# Patient Record
Sex: Male | Born: 2000 | Race: Black or African American | Hispanic: No | Marital: Single | Smoking: Never smoker
Health system: Southern US, Community
[De-identification: ages and names within clinical notes are randomized; demographics above are authoritative.]

## PROBLEM LIST (undated history)

## (undated) DIAGNOSIS — J45909 Unspecified asthma, uncomplicated: Secondary | ICD-10-CM

---
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
## 2019-10-25 ENCOUNTER — Other Ambulatory Visit: Payer: Self-pay

## 2019-10-25 ENCOUNTER — Ambulatory Visit: Payer: BC Managed Care – PPO | Admitting: Internal Medicine

## 2019-10-25 ENCOUNTER — Encounter: Payer: Self-pay | Admitting: Internal Medicine

## 2019-10-25 ENCOUNTER — Ambulatory Visit (INDEPENDENT_AMBULATORY_CARE_PROVIDER_SITE_OTHER): Payer: Medicaid Other | Admitting: Internal Medicine

## 2019-10-25 VITALS — BP 130/86 | HR 89 | Temp 97.2°F | Resp 18 | Ht 72.0 in | Wt >= 6400 oz

## 2019-10-25 DIAGNOSIS — R030 Elevated blood-pressure reading, without diagnosis of hypertension: Secondary | ICD-10-CM

## 2019-10-25 DIAGNOSIS — R7303 Prediabetes: Secondary | ICD-10-CM | POA: Diagnosis not present

## 2019-10-25 DIAGNOSIS — J4520 Mild intermittent asthma, uncomplicated: Secondary | ICD-10-CM

## 2019-10-25 DIAGNOSIS — Z7689 Persons encountering health services in other specified circumstances: Secondary | ICD-10-CM

## 2019-10-25 DIAGNOSIS — I10 Essential (primary) hypertension: Secondary | ICD-10-CM | POA: Insufficient documentation

## 2019-10-25 LAB — POCT GLYCOSYLATED HEMOGLOBIN (HGB A1C)
HbA1c POC (<> result, manual entry): 5.6 % (ref 4.0–5.6)
HbA1c, POC (controlled diabetic range): 5.6 % (ref 0.0–7.0)
HbA1c, POC (prediabetic range): 5.6 % — AB (ref 5.7–6.4)
Hemoglobin A1C: 5.6 % (ref 4.0–5.6)

## 2019-10-25 NOTE — Progress Notes (Signed)
New Patient Office Visit  Subjective:  Patient ID: Raymond Turner, male    DOB: 02/10/2000  Age: 19 y.o. MRN: [id number]  CC:  Chief Complaint  Patient presents with   New Patient (Initial Visit)    new pt former [facility] pediatric pt no concerns at this time     HPI Raymond Turner is a 19 year old male with PMH of asthma and morbid obesity presents for establishing care. Patient had dysuria last week, for which he took over-the-counter Azo, which helped him. He currently denies any dysuria, hematuria, flank pain, fever, chills, nausea or vomiting.  Patient had asthma in childhood, for which he used Albuterol inhaler PRN. He has not required it for long time.  Patient's BP was 158/94 initially, which upon manual check was 130/86. Patient denies any h/o HTN, and denies headache, dizziness, chest pain, dyspnea or palpitations. Patient has been trying to lose weight by going to gym and does cardio exercises at least 2-3 times in a week. 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  Patient has had both doses of COVID vaccination. Patient denies flu vaccine despite explaining benefits.  Past Medical History:  Diagnosis Date   Asthma     Past Surgical History:  Procedure Laterality Date   KNEE ARTHROSCOPY WITH ANTERIOR CRUCIATE LIGAMENT (ACL) REPAIR Left 08/08/2018   Procedure: KNEE ARTHROSCOPY WITH ANTERIOR CRUCIATE LIGAMENT (ACL) REPAIR;  Surgeon: Hiram Gash, MD;  Location: [place];  Service: Orthopedics;  Laterality: Left;   KNEE ARTHROSCOPY WITH MENISCAL REPAIR Left 08/08/2018   Procedure: KNEE ARTHROSCOPY WITH MENISCAL REPAIR;  Surgeon: Hiram Gash, MD;  Location: [place];  Service: Orthopedics;  Laterality: Left;    Family History  Problem Relation Age of Onset   Hypertension Mother     Social History   Socioeconomic History   Marital status: Single    Spouse name: Not on file   Number of children: Not on file   Years of education: Not  on file   Highest education level: Not on file  Occupational History    Employer: [employer]    Comment: Production team member  Tobacco Use   Smoking status: Never Smoker   Smokeless tobacco: Never Used  Vaping Use   Vaping Use: Never used  Substance and Sexual Activity   Alcohol use: Yes    Comment: Occasionally   Drug use: No   Sexual activity: Not on file  Other Topics Concern   Not on file  Social History Narrative   Lives with mother and brothers   Social Determinants of Health   Financial Resource Strain:    Difficulty of Paying Living Expenses: Not on file  Food Insecurity:    Worried About Charity fundraiser in the Last Year: Not on file   [employer] of Food in the Last Year: Not on file  Transportation Needs:    Lack of Transportation (Medical): Not on file   Lack of Transportation (Non-Medical): Not on file  Physical Activity:    Days of Exercise per Week: Not on file   Minutes of Exercise per Session: Not on file  Stress:    Feeling of Stress : Not on file  Social Connections:    Frequency of Communication with Friends and Family: Not on file   Frequency of Social Gatherings with Friends and Family: Not on file   Attends Religious Services: Not on file  Active Member of Clubs or Organizations: Not on file   Attends Archivist Meetings: Not on file   Marital Status: Not on file  Intimate Partner Violence:    Fear of Current or Ex-Partner: Not on file   Emotionally Abused: Not on file   Physically Abused: Not on file   Sexually Abused: Not on file    ROS Review of Systems  Constitutional: Negative for chills and fever.  HENT: Negative for congestion, sinus pressure, sinus pain and sore throat.   Eyes: Negative for pain and discharge.  Respiratory: Negative for cough and shortness of breath.   Cardiovascular: Negative for chest pain and palpitations.  Gastrointestinal: Negative for constipation, diarrhea, nausea  and vomiting.  Endocrine: Negative for polydipsia and polyuria.  Genitourinary: Negative for dysuria and hematuria.  Musculoskeletal: Negative for neck pain and neck stiffness.  Skin: Negative for rash.  Neurological: Negative for dizziness, weakness, numbness and headaches.  Psychiatric/Behavioral: Negative for agitation and behavioral problems.    Objective:   Today's Vitals: BP 130/86 (BP Location: Right Arm, Patient Position: Sitting)    Pulse 89    Temp (!) 97.2 F (36.2 C) (Temporal)    Resp 18    Ht 6' (1.829 m)    Wt (!) 455 lb 1.9 oz (206.4 kg)    SpO2 96%    BMI 61.73 kg/m   Physical Exam Vitals reviewed.  Constitutional:      General: He is not in acute distress.    Appearance: He is obese. He is not diaphoretic.  HENT:     Head: Normocephalic and atraumatic.     Nose: Nose normal.     Mouth/Throat:     Mouth: Mucous membranes are moist.  Eyes:     General: No scleral icterus.    Extraocular Movements: Extraocular movements intact.     Pupils: Pupils are equal, round, and reactive to light.  Cardiovascular:     Rate and Rhythm: Normal rate and regular rhythm.     Pulses: Normal pulses.     Heart sounds: Normal heart sounds. No murmur heard.  No friction rub. No gallop.   Pulmonary:     Breath sounds: Normal breath sounds. No wheezing or rales.  Abdominal:     Palpations: Abdomen is soft.     Tenderness: There is no abdominal tenderness. There is no right CVA tenderness, left CVA tenderness, guarding or rebound.  Musculoskeletal:     Cervical back: Neck supple. No rigidity or tenderness.     Right lower leg: No edema.     Left lower leg: No edema.  Skin:    General: Skin is warm.     Findings: No rash.  Neurological:     General: No focal deficit present.     Mental Status: He is alert and oriented to person, place, and time.     Sensory: No sensory deficit.     Motor: No weakness.  Psychiatric:        Mood and Affect: Mood normal.        Behavior:  Behavior normal.     Assessment & Plan:   Problem List Items Addressed This Visit      Encounter to establish care - Primary   Care established Previous chart reviewed History and medications reviewed with the patient     Relevant Orders  CBC with Differential  CMP14+EGFR  Lipid Profile  T4 AND TSH  POCT glycosylated hemoglobin (Hb A1C)   Prehypertension BP: 158/94,  repeat manual check BP: 130/86 EKG: Sinus arrhythmia. HR: 62. No ST or T wave abnormality to suggest active ischemia.  Denies headache, dizziness, chest pain, dyspnea or palpitations Advised to follow DASH diet and continue to perform moderate exercise, at least 150 mins/week F/u CMP and lipid profile  Prediabetes HbA1C: 5.6 today in office Advised to follow diabetic diet F/u CMP and lipid panel  Morbid obesity ([place]) Patient goes to gym for cardio exercise in an effort to lose weight. Discussed diet modification and continuing exercise Discussed medical management of weight, patient would like to try with diet modification and exercise for now. Will readdress in the next visit  Asthma, mild intermittent Well-controlled, does not need Albuterol inhaler now         Outpatient Encounter Medications as of 10/25/2019  Medication Sig   [DISCONTINUED] albuterol (PROVENTIL HFA;VENTOLIN HFA) 108 (90 BASE) MCG/ACT inhaler Inhale 2 puffs into the lungs every 6 (six) hours as needed for wheezing (4 times daily and as needed for cough/wheez.). (Patient not taking: Reported on 10/25/2019)   No facility-administered encounter medications on file as of 10/25/2019.    Follow-up: Return in about 3 months (around 01/25/2020).   Lindell Spar, MD

## 2019-10-25 NOTE — Assessment & Plan Note (Signed)
HbA1C: 5.6 today in office Advised to follow diabetic diet F/u CMP and lipid panel

## 2019-10-25 NOTE — Assessment & Plan Note (Signed)
Care established Previous chart reviewed History and medications reviewed with the patient 

## 2019-10-25 NOTE — Assessment & Plan Note (Signed)
Well-controlled, does not need Albuterol inhaler now

## 2019-10-25 NOTE — Assessment & Plan Note (Signed)
Patient goes to gym for cardio exercise in an effort to lose weight. Discussed diet modification and continuing exercise Discussed medical management of weight, patient would like to try with diet modification and exercise for now. Will readdress in the next visit

## 2019-10-25 NOTE — Patient Instructions (Signed)
Please continue to perform moderate exercise/yoga/aerobic exercise, at least 150 mins/week.  Please follow DASH diet for better control of blood pressure.  DASH stands for Dietary Approaches to Stop Hypertension. The DASH diet is a healthy-eating plan designed to help treat or prevent high blood pressure (hypertension).  The DASH diet includes foods that are rich in potassium, calcium and magnesium. These nutrients help control blood pressure. The diet limits foods that are high in sodium, saturated fat and added sugars.  Studies have shown that the DASH diet can lower blood pressure in as little as two weeks. The diet can also lower low-density lipoprotein (LDL or "bad") cholesterol levels in the blood. High blood pressure and high LDL cholesterol levels are two major risk factors for heart disease and stroke.    DASH diet: Recommended servings The DASH diet provides daily and weekly nutritional goals. The number of servings you should have depends on your daily calorie needs.  Here's a look at the recommended servings from each food group for a 2,000-calorie-a-day DASH diet:  Grains: 6 to 8 servings a day. One serving is one slice bread, 1 ounce dry cereal, or 1/2 cup cooked cereal, rice or pasta. Vegetables: 4 to 5 servings a day. One serving is 1 cup raw leafy green vegetable, 1/2 cup cut-up raw or cooked vegetables, or 1/2 cup vegetable juice. Fruits: 4 to 5 servings a day. One serving is one medium fruit, 1/2 cup fresh, frozen or canned fruit, or 1/2 cup fruit juice. Fat-free or low-fat dairy products: 2 to 3 servings a day. One serving is 1 cup milk or yogurt, or 1 1/2 ounces cheese. Lean meats, poultry and fish: six 1-ounce servings or fewer a day. One serving is 1 ounce cooked meat, poultry or fish, or 1 egg. Nuts, seeds and legumes: 4 to 5 servings a week. One serving is 1/3 cup nuts, 2 tablespoons peanut butter, 2 tablespoons seeds, or 1/2 cup cooked legumes (dried beans or  peas). Fats and oils: 2 to 3 servings a day. One serving is 1 teaspoon soft margarine, 1 teaspoon vegetable oil, 1 tablespoon mayonnaise or 2 tablespoons salad dressing. Sweets and added sugars: 5 servings or fewer a week. One serving is 1 tablespoon sugar, jelly or jam, 1/2 cup sorbet, or 1 cup lemonade.

## 2019-10-25 NOTE — Assessment & Plan Note (Addendum)
BP: 158/94, repeat manual check BP: 130/86 EKG: Sinus arrhythmia. HR: 62. No ST or T wave abnormality to suggest active ischemia.  Denies headache, dizziness, chest pain, dyspnea or palpitations Advised to follow DASH diet and continue to perform moderate exercise, at least 150 mins/week F/u CMP and lipid profile

## 2019-10-26 LAB — CBC WITH DIFFERENTIAL/PLATELET
Basophils Absolute: 0 10*3/uL (ref 0.0–0.2)
Basos: 1 %
EOS (ABSOLUTE): 0.2 10*3/uL (ref 0.0–0.4)
Eos: 3 %
Hematocrit: 43 % (ref 37.5–51.0)
Hemoglobin: 13.9 g/dL (ref 13.0–17.7)
Immature Grans (Abs): 0 10*3/uL (ref 0.0–0.1)
Immature Granulocytes: 0 %
Lymphocytes Absolute: 2.6 10*3/uL (ref 0.7–3.1)
Lymphs: 45 %
MCH: 27.9 pg (ref 26.6–33.0)
MCHC: 32.3 g/dL (ref 31.5–35.7)
MCV: 86 fL (ref 79–97)
Monocytes Absolute: 0.3 10*3/uL (ref 0.1–0.9)
Monocytes: 6 %
Neutrophils Absolute: 2.6 10*3/uL (ref 1.4–7.0)
Neutrophils: 45 %
Platelets: 270 10*3/uL (ref 150–450)
RBC: 4.98 x10E6/uL (ref 4.14–5.80)
RDW: 13 % (ref 11.6–15.4)
WBC: 5.7 10*3/uL (ref 3.4–10.8)

## 2019-10-26 LAB — LIPID PANEL
Chol/HDL Ratio: 3.7 ratio (ref 0.0–5.0)
Cholesterol, Total: 161 mg/dL (ref 100–199)
HDL: 43 mg/dL (ref >39)
LDL Chol Calc (NIH): 101 mg/dL — ABNORMAL HIGH (ref 0–99)
Triglycerides: 92 mg/dL (ref 0–149)
VLDL Cholesterol Cal: 17 mg/dL (ref 5–40)

## 2019-10-26 LAB — CMP14+EGFR
ALT: 56 IU/L — ABNORMAL HIGH (ref 0–44)
AST: 33 IU/L (ref 0–40)
Albumin/Globulin Ratio: 1.5 (ref 1.2–2.2)
Albumin: 4.4 g/dL (ref 4.1–5.2)
Alkaline Phosphatase: 77 IU/L (ref 51–125)
BUN/Creatinine Ratio: 14 (ref 9–20)
BUN: 11 mg/dL (ref 6–20)
Bilirubin Total: 0.6 mg/dL (ref 0.0–1.2)
CO2: 20 mmol/L (ref 20–29)
Calcium: 9.4 mg/dL (ref 8.7–10.2)
Chloride: 106 mmol/L (ref 96–106)
Creatinine, Ser: 0.81 mg/dL (ref 0.76–1.27)
GFR calc Af Amer: 148 mL/min/{1.73_m2} (ref >59)
GFR calc non Af Amer: 128 mL/min/{1.73_m2} (ref >59)
Globulin, Total: 2.9 g/dL (ref 1.5–4.5)
Glucose: 77 mg/dL (ref 65–99)
Potassium: 4.7 mmol/L (ref 3.5–5.2)
Sodium: 140 mmol/L (ref 134–144)
Total Protein: 7.3 g/dL (ref 6.0–8.5)

## 2019-10-26 LAB — T4 AND TSH
T4, Total: 8.1 ug/dL (ref 4.5–12.0)
TSH: 2.65 u[IU]/mL (ref 0.450–4.500)

## 2020-01-22 ENCOUNTER — Telehealth (INDEPENDENT_AMBULATORY_CARE_PROVIDER_SITE_OTHER): Payer: Medicaid Other | Admitting: Internal Medicine

## 2020-01-22 ENCOUNTER — Encounter: Payer: Self-pay | Admitting: Internal Medicine

## 2020-01-22 ENCOUNTER — Other Ambulatory Visit: Payer: Self-pay

## 2020-01-22 VITALS — Ht 72.0 in

## 2020-01-22 DIAGNOSIS — R7303 Prediabetes: Secondary | ICD-10-CM | POA: Diagnosis not present

## 2020-01-22 DIAGNOSIS — R030 Elevated blood-pressure reading, without diagnosis of hypertension: Secondary | ICD-10-CM | POA: Diagnosis not present

## 2020-01-22 NOTE — Progress Notes (Signed)
Virtual Visit via Telephone Note   This visit type was conducted due to national recommendations for restrictions regarding the COVID-19 Pandemic (e.g. social distancing) in an effort to limit this patient's exposure and mitigate transmission in our community.  Due to his co-morbid illnesses, this patient is at least at moderate risk for complications without adequate follow up.  This format is felt to be most appropriate for this patient at this time.  The patient did not have access to video technology/had technical difficulties with video requiring transitioning to audio format only (telephone).  All issues noted in this document were discussed and addressed.  No physical exam could be performed with this format.  Evaluation Performed:  Follow-up visit  Date:  01/22/2020   ID:  Raymond Turner, DOB 07-13-2000, MRN [id number]  Patient Location: Home Provider Location: Home Office  Participants: Patient Location of Patient: Home Location of Provider: Telehealth Consent was obtain for visit to be over via telehealth. I verified that I am speaking with the correct person using two identifiers.  PCP:  Anabel Halon, MD   Chief Complaint: Follow-up of chronic medical conditions  History of Present Illness:    Raymond Turner is a 20 y.o. male with PMH of asthma, pre-hypertension, prediabetes and morbid obesity who has a televisit for follow-up of his chronic medical conditions.  Patient states that he has been doing well overall.  He has been trying to follow low carbohydrate diet for his prediabetes.  He has not checked his BP since the last visit.  He denies any headache, dizziness, chest pain, dyspnea or palpitations.  The patient does not have symptoms concerning for COVID-19 infection (fever, chills, cough, or new shortness of breath).   Past Medical, Surgical, Social History, Allergies, and Medications have been Reviewed.  Past Medical History:  Diagnosis Date  . Asthma     Past Surgical History:  Procedure Laterality Date  . KNEE ARTHROSCOPY WITH ANTERIOR CRUCIATE LIGAMENT (ACL) REPAIR Left 08/08/2018   Procedure: KNEE ARTHROSCOPY WITH ANTERIOR CRUCIATE LIGAMENT (ACL) REPAIR;  Surgeon: Bjorn Pippin, MD;  Location: [facility] OR;  Service: Orthopedics;  Laterality: Left;  . KNEE ARTHROSCOPY WITH MENISCAL REPAIR Left 08/08/2018   Procedure: KNEE ARTHROSCOPY WITH MENISCAL REPAIR;  Surgeon: Bjorn Pippin, MD;  Location: [facility] OR;  Service: Orthopedics;  Laterality: Left;     No outpatient medications have been marked as taking for the 01/22/20 encounter (Video Visit) with Anabel Halon, MD.     Allergies:   Patient has no known allergies.   ROS:   Please see the history of present illness.     All other systems reviewed and are negative.   Labs/Other Tests and Data Reviewed:    Recent Labs: 10/25/2019: ALT 56; BUN 11; Creatinine, Ser 0.81; Hemoglobin 13.9; Platelets 270; Potassium 4.7; Sodium 140; TSH 2.650   Recent Lipid Panel Lab Results  Component Value Date/Time   CHOL 161 10/25/2019 10:52 AM   TRIG 92 10/25/2019 10:52 AM   HDL 43 10/25/2019 10:52 AM   CHOLHDL 3.7 10/25/2019 10:52 AM   CHOLHDL 2.9 08/15/2015 03:45 PM   LDLCALC 101 (H) 10/25/2019 10:52 AM    Wt Readings from Last 3 Encounters:  10/25/19 (!) 455 lb 1.9 oz (206.4 kg)  08/08/18 (!) 417 lb (189.1 kg) (>99 %, Z= 3.99)*  08/04/18 (!) 414 lb 4 oz (187.9 kg) (>99 %, Z= 3.97)*   * Growth percentiles are based on CDC (Boys, 2-20 Years)  data.      ASSESSMENT & PLAN:    Prediabetes Advised to continue to follow low carbohydrate diet We will check CMP and HbA1C in the next visit  Prehypertension Advised to follow low sodium diet and perform moderate exercise/walking  Morbid obesity Diet modification and moderate exercise advised.   Time:   Today, I have spent 10 minutes reviewing the chart, including problem list, medications, and with the patient with telehealth technology  discussing the above problems.   Medication Adjustments/Labs and Tests Ordered: Current medicines are reviewed at length with the patient today.  Concerns regarding medicines are outlined above.   Tests Ordered: No orders of the defined types were placed in this encounter.   Medication Changes: No orders of the defined types were placed in this encounter.    Note: This dictation was prepared with Dragon dictation along with smaller phrase technology. Similar sounding words can be transcribed inadequately or may not be corrected upon review. Any transcriptional errors that result from this process are unintentional.      Disposition:  Follow up  Signed, Anabel Halon, MD  01/22/2020 11:45 AM     [facility]

## 2020-01-22 NOTE — Patient Instructions (Signed)
Please continue to follow low carbohydrate and low-sodium diet.  Please perform moderate exercise/walking at least 150 minutes/week.  You are advised to check blood pressure at home or pharmacy.  Please contact us if your blood pressure is persistently elevated more than 140/90.

## 2020-01-25 ENCOUNTER — Ambulatory Visit: Payer: Medicaid Other | Admitting: Internal Medicine

## 2020-07-15 ENCOUNTER — Encounter: Payer: Self-pay | Admitting: Pediatrics

## 2020-07-22 ENCOUNTER — Encounter: Payer: Medicaid Other | Admitting: Internal Medicine

## 2020-08-26 ENCOUNTER — Other Ambulatory Visit: Payer: Self-pay

## 2020-08-26 ENCOUNTER — Encounter: Payer: Self-pay | Admitting: Internal Medicine

## 2020-08-26 ENCOUNTER — Ambulatory Visit (INDEPENDENT_AMBULATORY_CARE_PROVIDER_SITE_OTHER): Payer: Medicaid Other | Admitting: Internal Medicine

## 2020-08-26 VITALS — BP 152/96 | HR 102 | Temp 98.1°F | Ht 72.0 in | Wt >= 6400 oz

## 2020-08-26 DIAGNOSIS — Z1159 Encounter for screening for other viral diseases: Secondary | ICD-10-CM

## 2020-08-26 DIAGNOSIS — Z0001 Encounter for general adult medical examination with abnormal findings: Secondary | ICD-10-CM

## 2020-08-26 DIAGNOSIS — Z114 Encounter for screening for human immunodeficiency virus [HIV]: Secondary | ICD-10-CM | POA: Diagnosis not present

## 2020-08-26 DIAGNOSIS — I10 Essential (primary) hypertension: Secondary | ICD-10-CM | POA: Diagnosis not present

## 2020-08-26 MED ORDER — AMLODIPINE BESYLATE 5 MG PO TABS: 5.0000 mg | ORAL_TABLET | Freq: Every day | ORAL | 0 refills | Status: DC

## 2020-08-26 NOTE — Assessment & Plan Note (Signed)
BP Readings from Last 1 Encounters:  08/26/20 (!) 152/96   New-onset, had prehypertension range BP in the last visit Started Amlodipine 5 mg QD Counseled for compliance with the medications Advised DASH diet and moderate exercise/walking, at least 150 mins/week

## 2020-08-26 NOTE — Patient Instructions (Signed)
Health Maintenance, Male Adopting a healthy lifestyle and getting preventive care are important in promoting health and wellness. Ask your health care provider about: The right schedule for you to have regular tests and exams. Things you can do on your own to prevent diseases and keep yourself healthy. What should I know about diet, weight, and exercise? Eat a healthy diet  Eat a diet that includes plenty of vegetables, fruits, low-fat dairy products, and lean protein. Do not eat a lot of foods that are high in solid fats, added sugars, or sodium.  Maintain a healthy weight Body mass index (BMI) is a measurement that can be used to identify possible weight problems. It estimates body fat based on height and weight. Your health care provider can help determine your BMI and help you achieve or maintain ahealthy weight. Get regular exercise Get regular exercise. This is one of the most important things you can do for your health. Most adults should: Exercise for at least 150 minutes each week. The exercise should increase your heart rate and make you sweat (moderate-intensity exercise). Do strengthening exercises at least twice a week. This is in addition to the moderate-intensity exercise. Spend less time sitting. Even light physical activity can be beneficial. Watch cholesterol and blood lipids Have your blood tested for lipids and cholesterol at 20 years of age, then havethis test every 5 years. You may need to have your cholesterol levels checked more often if: Your lipid or cholesterol levels are high. You are older than 21 years of age. You are at high risk for heart disease. What should I know about cancer screening? Many types of cancers can be detected early and may often be prevented. Depending on your health history and family history, you may need to have cancer screening at various ages. This may include screening for: Colorectal cancer. Prostate cancer. Skin cancer. Lung  cancer. What should I know about heart disease, diabetes, and high blood pressure? Blood pressure and heart disease High blood pressure causes heart disease and increases the risk of stroke. This is more likely to develop in people who have high blood pressure readings, are of African descent, or are overweight. Talk with your health care provider about your target blood pressure readings. Have your blood pressure checked: Every 3-5 years if you are 18-39 years of age. Every year if you are 40 years old or older. If you are between the ages of 65 and 75 and are a current or former smoker, ask your health care provider if you should have a one-time screening for abdominal aortic aneurysm (AAA). Diabetes Have regular diabetes screenings. This checks your fasting blood sugar level. Have the screening done: Once every three years after age 45 if you are at a normal weight and have a low risk for diabetes. More often and at a younger age if you are overweight or have a high risk for diabetes. What should I know about preventing infection? Hepatitis B If you have a higher risk for hepatitis B, you should be screened for this virus. Talk with your health care provider to find out if you are at risk forhepatitis B infection. Hepatitis C Blood testing is recommended for: Everyone born from 1945 through 1965. Anyone with known risk factors for hepatitis C. Sexually transmitted infections (STIs) You should be screened each year for STIs, including gonorrhea and chlamydia, if: You are sexually active and are younger than 21 years of age. You are older than 21 years of age   and your health care provider tells you that you are at risk for this type of infection. Your sexual activity has changed since you were last screened, and you are at increased risk for chlamydia or gonorrhea. Ask your health care provider if you are at risk. Ask your health care provider about whether you are at high risk for HIV.  Your health care provider may recommend a prescription medicine to help prevent HIV infection. If you choose to take medicine to prevent HIV, you should first get tested for HIV. You should then be tested every 3 months for as long as you are taking the medicine. Follow these instructions at home: Lifestyle Do not use any products that contain nicotine or tobacco, such as cigarettes, e-cigarettes, and chewing tobacco. If you need help quitting, ask your health care provider. Do not use street drugs. Do not share needles. Ask your health care provider for help if you need support or information about quitting drugs. Alcohol use Do not drink alcohol if your health care provider tells you not to drink. If you drink alcohol: Limit how much you have to 0-2 drinks a day. Be aware of how much alcohol is in your drink. In the [place] one drink equals one 12 oz bottle of beer (355 mL), one 5 oz glass of wine (148 mL), or one 1 oz glass of hard liquor (44 mL). General instructions Schedule regular health, dental, and eye exams. Stay current with your vaccines. Tell your health care provider if: You often feel depressed. You have ever been abused or do not feel safe at home. Summary Adopting a healthy lifestyle and getting preventive care are important in promoting health and wellness. Follow your health care provider's instructions about healthy diet, exercising, and getting tested or screened for diseases. Follow your health care provider's instructions on monitoring your cholesterol and blood pressure. This information is not intended to replace advice given to you by your health care provider. Make sure you discuss any questions you have with your healthcare provider. Document Revised: 12/15/2017 Document Reviewed: 12/15/2017 Elsevier Patient Education  2022 Elsevier Inc.  

## 2020-08-26 NOTE — Assessment & Plan Note (Signed)
Patient goes to gym for cardio exercise in an effort to lose weight. Discussed diet modification and continuing exercise Would like to add GLP1 agonist for weight loss, will try to add depending on HbA1C

## 2020-08-26 NOTE — Progress Notes (Signed)
Established Patient Office Visit  Subjective:  Patient ID: Raymond Turner, male    DOB: March 09, 2000  Age: 20 y.o. MRN: [id number]  CC:  Chief Complaint  Patient presents with   Annual Exam    HPI Raymond Turner is a 20 year old male with PMH of asthma and morbid obesity who presents for annual physical.  He had COVID infection about 2 weeks ago, but did well with OTC Nyquil and Tylenol. Denies any cough, fever, chills, dyspnea or wheezing currently.  His BP was elevated on multiple measurements today. He denies any headache, dizziness, chest pain, dyspnea or palpitations.  Patient has been trying to lose weight by going to gym and does cardio exercises at least 2-3 times in a week. He is not able to go everyday as he does 12 hour shifts at [employer]. He has lost about 9 lbs since 10/2019.   Past Medical History:  Diagnosis Date   Acanthosis nigricans 08/17/2014   Asthma     Past Surgical History:  Procedure Laterality Date   KNEE ARTHROSCOPY WITH ANTERIOR CRUCIATE LIGAMENT (ACL) REPAIR Left 08/08/2018   Procedure: KNEE ARTHROSCOPY WITH ANTERIOR CRUCIATE LIGAMENT (ACL) REPAIR;  Surgeon: Bjorn Pippin, MD;  Location: [facility] OR;  Service: Orthopedics;  Laterality: Left;   KNEE ARTHROSCOPY WITH MENISCAL REPAIR Left 08/08/2018   Procedure: KNEE ARTHROSCOPY WITH MENISCAL REPAIR;  Surgeon: Bjorn Pippin, MD;  Location: [facility] OR;  Service: Orthopedics;  Laterality: Left;    Family History  Problem Relation Age of Onset   Hypertension Mother     Social History   Socioeconomic History   Marital status: Single    Spouse name: Not on file   Number of children: Not on file   Years of education: Not on file   Highest education level: Not on file  Occupational History    Employer: [employer]    Comment: Production team member  Tobacco Use   Smoking status: Never   Smokeless tobacco: Never  Vaping Use   Vaping Use: Never used  Substance and Sexual Activity   Alcohol use: Yes     Comment: Occasionally   Drug use: No   Sexual activity: Not on file  Other Topics Concern   Not on file  Social History Narrative   Lives with mother and brothers   Social Determinants of Health   Financial Resource Strain: Not on file  Food Insecurity: Not on file  Transportation Needs: Not on file  Physical Activity: Not on file  Stress: Not on file  Social Connections: Not on file  Intimate Partner Violence: Not on file    No outpatient medications prior to visit.   No facility-administered medications prior to visit.    No Known Allergies  ROS Review of Systems  Constitutional:  Negative for chills and fever.  HENT:  Negative for congestion, sinus pressure, sinus pain and sore throat.   Eyes:  Negative for pain and discharge.  Respiratory:  Negative for cough and shortness of breath.   Cardiovascular:  Negative for chest pain and palpitations.  Gastrointestinal:  Negative for constipation, diarrhea, nausea and vomiting.  Endocrine: Negative for polydipsia and polyuria.  Genitourinary:  Negative for dysuria and hematuria.  Musculoskeletal:  Negative for neck pain and neck stiffness.  Skin:  Negative for rash.  Neurological:  Negative for dizziness, weakness, numbness and headaches.  Psychiatric/Behavioral:  Negative for agitation and behavioral problems.      Objective:    Physical Exam  Vitals reviewed.  Constitutional:      General: He is not in acute distress.    Appearance: He is obese. He is not diaphoretic.  HENT:     Head: Normocephalic and atraumatic.     Nose: Nose normal.     Mouth/Throat:     Mouth: Mucous membranes are moist.  Eyes:     General: No scleral icterus.    Extraocular Movements: Extraocular movements intact.  Cardiovascular:     Rate and Rhythm: Normal rate and regular rhythm.     Pulses: Normal pulses.     Heart sounds: Normal heart sounds. No murmur heard.   No friction rub. No gallop.  Pulmonary:     Breath sounds: Normal  breath sounds. No wheezing or rales.  Abdominal:     Palpations: Abdomen is soft.     Tenderness: There is no abdominal tenderness.  Musculoskeletal:     Cervical back: Neck supple. No rigidity or tenderness.     Right lower leg: No edema.     Left lower leg: No edema.  Skin:    General: Skin is warm.     Findings: No rash.  Neurological:     General: No focal deficit present.     Mental Status: He is alert and oriented to person, place, and time.     Cranial Nerves: No cranial nerve deficit.     Sensory: No sensory deficit.     Motor: No weakness.  Psychiatric:        Mood and Affect: Mood normal.        Behavior: Behavior normal.    BP (!) 152/96 (BP Location: Right Arm, Cuff Size: Normal)   Pulse (!) 102   Temp 98.1 F (36.7 C)   Ht 6' (1.829 m)   Wt (!) 446 lb 1.9 oz (202.4 kg)   SpO2 98%   BMI 60.50 kg/m  Wt Readings from Last 3 Encounters:  08/26/20 (!) 446 lb 1.9 oz (202.4 kg)  10/25/19 (!) 455 lb 1.9 oz (206.4 kg)  08/08/18 (!) 417 lb (189.1 kg) (>99 %, Z= 3.99)*   * Growth percentiles are based on CDC (Boys, 2-20 Years) data.     Health Maintenance Due  Topic Date Due   COVID-19 Vaccine (1) Never done   HPV VACCINES (2 - Male 3-dose series) 09/12/2015   Hepatitis C Screening  Never done   TETANUS/TDAP  06/03/2020   INFLUENZA VACCINE  08/05/2020       Topic Date Due   HPV VACCINES (2 - Male 3-dose series) 09/12/2015    Lab Results  Component Value Date   TSH 2.650 10/25/2019   Lab Results  Component Value Date   WBC 5.7 10/25/2019   HGB 13.9 10/25/2019   HCT 43.0 10/25/2019   MCV 86 10/25/2019   PLT 270 10/25/2019   Lab Results  Component Value Date   NA 140 10/25/2019   K 4.7 10/25/2019   CO2 20 10/25/2019   GLUCOSE 77 10/25/2019   BUN 11 10/25/2019   CREATININE 0.81 10/25/2019   BILITOT 0.6 10/25/2019   ALKPHOS 77 10/25/2019   AST 33 10/25/2019   ALT 56 (H) 10/25/2019   PROT 7.3 10/25/2019   ALBUMIN 4.4 10/25/2019   CALCIUM 9.4  10/25/2019   ANIONGAP 6 03/20/2014   Lab Results  Component Value Date   CHOL 161 10/25/2019   Lab Results  Component Value Date   HDL 43 10/25/2019   Lab Results  Component  Value Date   LDLCALC 101 (H) 10/25/2019   Lab Results  Component Value Date   TRIG 92 10/25/2019   Lab Results  Component Value Date   CHOLHDL 3.7 10/25/2019   Lab Results  Component Value Date   HGBA1C 5.6 10/25/2019   HGBA1C 5.6 10/25/2019   HGBA1C 5.6 (A) 10/25/2019   HGBA1C 5.6 10/25/2019      Assessment & Plan:   Problem List Items Addressed This Visit       Encounter for general adult medical examination with abnormal findings - Primary   Annual exam as documented. Counseling done  re healthy lifestyle involving commitment to 150 minutes exercise per week, heart healthy diet, and attaining healthy weight.The importance of adequate sleep also discussed. Changes in health habits are decided on by the patient with goals and time frames  set for achieving them. Immunization and cancer screening needs are specifically addressed at this visit.     Relevant Orders  CBC with Differential  Comprehensive metabolic panel  Hemoglobin A1c  Lipid panel  TSH    Cardiovascular and Mediastinum   Essential hypertension    BP Readings from Last 1 Encounters:  08/26/20 (!) 152/96  New-onset, had prehypertension range BP in the last visit Started Amlodipine 5 mg QD Counseled for compliance with the medications Advised DASH diet and moderate exercise/walking, at least 150 mins/week       Relevant Medications   amLODipine (NORVASC) 5 MG tablet     Other   Morbid obesity (HCC)    Patient goes to gym for cardio exercise in an effort to lose weight. Discussed diet modification and continuing exercise Would like to add GLP1 agonist for weight loss, will try to add depending on HbA1C      Other Visit Diagnoses     Encounter for screening for HIV       Relevant Orders   HIV antibody   Need  for hepatitis C screening test       Relevant Orders   Hepatitis C antibody screen       Meds ordered this encounter  Medications   amLODipine (NORVASC) 5 MG tablet    Sig: Take 1 tablet (5 mg total) by mouth daily.    Dispense:  30 tablet    Refill:  0    Follow-up: Return in about 4 weeks (around 09/23/2020) for HTN.    Anabel Halon, MD

## 2020-08-26 NOTE — Assessment & Plan Note (Signed)
Annual exam as documented. Counseling done  re healthy lifestyle involving commitment to 150 minutes exercise per week, heart healthy diet, and attaining healthy weight.The importance of adequate sleep also discussed. Changes in health habits are decided on by the patient with goals and time frames  set for achieving them. Immunization and cancer screening needs are specifically addressed at this visit. 

## 2020-08-27 LAB — CBC WITH DIFFERENTIAL/PLATELET
Basophils Absolute: 0 10*3/uL (ref 0.0–0.2)
Basos: 1 %
EOS (ABSOLUTE): 0.2 10*3/uL (ref 0.0–0.4)
Eos: 3 %
Hematocrit: 41 % (ref 37.5–51.0)
Hemoglobin: 13.5 g/dL (ref 13.0–17.7)
Immature Grans (Abs): 0 10*3/uL (ref 0.0–0.1)
Immature Granulocytes: 0 %
Lymphocytes Absolute: 2.4 10*3/uL (ref 0.7–3.1)
Lymphs: 40 %
MCH: 27.7 pg (ref 26.6–33.0)
MCHC: 32.9 g/dL (ref 31.5–35.7)
MCV: 84 fL (ref 79–97)
Monocytes Absolute: 0.4 10*3/uL (ref 0.1–0.9)
Monocytes: 7 %
Neutrophils Absolute: 2.9 10*3/uL (ref 1.4–7.0)
Neutrophils: 49 %
Platelets: 269 10*3/uL (ref 150–450)
RBC: 4.87 x10E6/uL (ref 4.14–5.80)
RDW: 12.7 % (ref 11.6–15.4)
WBC: 5.9 10*3/uL (ref 3.4–10.8)

## 2020-08-27 LAB — COMPREHENSIVE METABOLIC PANEL
ALT: 52 IU/L — ABNORMAL HIGH (ref 0–44)
AST: 31 IU/L (ref 0–40)
Albumin/Globulin Ratio: 1.4 (ref 1.2–2.2)
Albumin: 4.2 g/dL (ref 4.1–5.2)
Alkaline Phosphatase: 76 IU/L (ref 44–121)
BUN/Creatinine Ratio: 12 (ref 9–20)
BUN: 11 mg/dL (ref 6–20)
Bilirubin Total: 0.5 mg/dL (ref 0.0–1.2)
CO2: 19 mmol/L — ABNORMAL LOW (ref 20–29)
Calcium: 9.7 mg/dL (ref 8.7–10.2)
Chloride: 104 mmol/L (ref 96–106)
Creatinine, Ser: 0.94 mg/dL (ref 0.76–1.27)
Globulin, Total: 3 g/dL (ref 1.5–4.5)
Glucose: 93 mg/dL (ref 65–99)
Potassium: 4.9 mmol/L (ref 3.5–5.2)
Sodium: 142 mmol/L (ref 134–144)
Total Protein: 7.2 g/dL (ref 6.0–8.5)
eGFR: 118 mL/min/{1.73_m2} (ref >59)

## 2020-08-27 LAB — LIPID PANEL
Chol/HDL Ratio: 3 ratio (ref 0.0–5.0)
Cholesterol, Total: 142 mg/dL (ref 100–199)
HDL: 48 mg/dL (ref >39)
LDL Chol Calc (NIH): 78 mg/dL (ref 0–99)
Triglycerides: 84 mg/dL (ref 0–149)
VLDL Cholesterol Cal: 16 mg/dL (ref 5–40)

## 2020-08-27 LAB — HEMOGLOBIN A1C
Est. average glucose Bld gHb Est-mCnc: 123 mg/dL
Hgb A1c MFr Bld: 5.9 % — ABNORMAL HIGH (ref 4.8–5.6)

## 2020-08-27 LAB — HIV ANTIBODY (ROUTINE TESTING W REFLEX): HIV Screen 4th Generation wRfx: NONREACTIVE

## 2020-08-27 LAB — HEPATITIS C ANTIBODY: Hep C Virus Ab: <0.1 s/co ratio (ref 0.0–0.9)

## 2020-08-27 LAB — TSH: TSH: 1.51 u[IU]/mL (ref 0.450–4.500)

## 2020-09-22 ENCOUNTER — Other Ambulatory Visit: Payer: Self-pay | Admitting: Internal Medicine

## 2020-09-22 DIAGNOSIS — I10 Essential (primary) hypertension: Secondary | ICD-10-CM

## 2020-09-24 ENCOUNTER — Ambulatory Visit: Payer: Medicaid Other | Admitting: Internal Medicine

## 2020-10-04 ENCOUNTER — Ambulatory Visit: Payer: Medicaid Other | Admitting: Internal Medicine

## 2020-10-04 ENCOUNTER — Encounter: Payer: Self-pay | Admitting: Internal Medicine

## 2020-10-04 ENCOUNTER — Other Ambulatory Visit: Payer: Self-pay

## 2020-10-04 VITALS — BP 140/92 | HR 80 | Temp 98.4°F | Resp 18 | Ht 72.0 in | Wt >= 6400 oz

## 2020-10-04 DIAGNOSIS — R7303 Prediabetes: Secondary | ICD-10-CM

## 2020-10-04 DIAGNOSIS — I10 Essential (primary) hypertension: Secondary | ICD-10-CM | POA: Diagnosis not present

## 2020-10-04 MED ORDER — AMLODIPINE BESYLATE 5 MG PO TABS: ORAL_TABLET | ORAL | 3 refills | Status: DC

## 2020-10-04 MED ORDER — HYDROCHLOROTHIAZIDE 12.5 MG PO CAPS: 12.5000 mg | ORAL_CAPSULE | Freq: Every day | ORAL | 3 refills | Status: AC

## 2020-10-04 NOTE — Assessment & Plan Note (Signed)
HbA1C: 5.9 Advised to follow diabetic diet

## 2020-10-04 NOTE — Assessment & Plan Note (Signed)
BP Readings from Last 1 Encounters:  10/04/20 (!) 140/92   Uncontrolled with Amlodipine 5 mg QD Started HCTZ 12.5 mg QD Counseled for compliance with the medications Advised DASH diet and moderate exercise/walking, at least 150 mins/week

## 2020-10-04 NOTE — Progress Notes (Signed)
Established Patient Office Visit  Subjective:  Patient ID: Raymond Turner, male    DOB: Dec 30, 2000  Age: 20 y.o. MRN: [id number]  CC:  Chief Complaint  Patient presents with   Follow-up    Follow up pt is fine right now     HPI Raymond Turner is a 20 year old male with PMH of HTN, prediabetes, asthma and morbid obesity who presents for f/u of HTN.  HTN: His blood pressure was elevated in the office today upon multiple measurements.  He has been taking amlodipine regularly.  He has been trying to follow DASH diet and exercise at gym about 2-3 times in a week, has his schedule permits. He denies any headache, dizziness, chest pain, dyspnea or palpitations.   Past Medical History:  Diagnosis Date   Acanthosis nigricans 08/17/2014   Asthma     Past Surgical History:  Procedure Laterality Date   KNEE ARTHROSCOPY WITH ANTERIOR CRUCIATE LIGAMENT (ACL) REPAIR Left 08/08/2018   Procedure: KNEE ARTHROSCOPY WITH ANTERIOR CRUCIATE LIGAMENT (ACL) REPAIR;  Surgeon: Hiram Gash, MD;  Location: [place];  Service: Orthopedics;  Laterality: Left;   KNEE ARTHROSCOPY WITH MENISCAL REPAIR Left 08/08/2018   Procedure: KNEE ARTHROSCOPY WITH MENISCAL REPAIR;  Surgeon: Hiram Gash, MD;  Location: [place];  Service: Orthopedics;  Laterality: Left;    Family History  Problem Relation Age of Onset   Hypertension Mother     Social History   Socioeconomic History   Marital status: Single    Spouse name: Not on file   Number of children: Not on file   Years of education: Not on file   Highest education level: Not on file  Occupational History    Employer: [employer]    Comment: Production team member  Tobacco Use   Smoking status: Never   Smokeless tobacco: Never  Vaping Use   Vaping Use: Never used  Substance and Sexual Activity   Alcohol use: Yes    Comment: Occasionally   Drug use: No   Sexual activity: Not on file  Other Topics Concern   Not on file  Social History Narrative    Lives with mother and brothers   Social Determinants of Health   Financial Resource Strain: Not on file  Food Insecurity: Not on file  Transportation Needs: Not on file  Physical Activity: Not on file  Stress: Not on file  Social Connections: Not on file  Intimate Partner Violence: Not on file    Outpatient Medications Prior to Visit  Medication Sig Dispense Refill   amLODipine (NORVASC) 5 MG tablet TAKE 1 TABLET(5 MG) BY MOUTH DAILY 30 tablet 0   No facility-administered medications prior to visit.    No Known Allergies  ROS Review of Systems  Constitutional:  Negative for chills and fever.  HENT:  Negative for congestion, sinus pressure, sinus pain and sore throat.   Eyes:  Negative for pain and discharge.  Respiratory:  Negative for cough and shortness of breath.   Cardiovascular:  Negative for chest pain and palpitations.  Gastrointestinal:  Negative for constipation, diarrhea, nausea and vomiting.  Endocrine: Negative for polydipsia and polyuria.  Genitourinary:  Negative for dysuria and hematuria.  Musculoskeletal:  Negative for neck pain and neck stiffness.  Skin:  Negative for rash.  Neurological:  Negative for dizziness, weakness, numbness and headaches.  Psychiatric/Behavioral:  Negative for agitation and behavioral problems.      Objective:    Physical Exam Vitals reviewed.  Constitutional:  General: He is not in acute distress.    Appearance: He is obese. He is not diaphoretic.  HENT:     Head: Normocephalic and atraumatic.     Nose: Nose normal.     Mouth/Throat:     Mouth: Mucous membranes are moist.  Eyes:     General: No scleral icterus.    Extraocular Movements: Extraocular movements intact.  Cardiovascular:     Rate and Rhythm: Normal rate and regular rhythm.     Pulses: Normal pulses.     Heart sounds: Normal heart sounds. No murmur heard. Pulmonary:     Breath sounds: Normal breath sounds. No wheezing or rales.  Abdominal:      Palpations: Abdomen is soft.     Tenderness: There is no abdominal tenderness.  Musculoskeletal:     Cervical back: Neck supple. No rigidity or tenderness.     Right lower leg: No edema.     Left lower leg: No edema.  Skin:    General: Skin is warm.     Findings: No rash.  Neurological:     General: No focal deficit present.     Mental Status: He is alert and oriented to person, place, and time.     Sensory: No sensory deficit.     Motor: No weakness.  Psychiatric:        Mood and Affect: Mood normal.        Behavior: Behavior normal.    BP (!) 140/92 (BP Location: Left Arm, Cuff Size: Normal)   Pulse 80   Temp 98.4 F (36.9 C) (Oral)   Resp 18   Ht 6' (1.829 m)   Wt (!) 448 lb (203.2 kg)   SpO2 96%   BMI 60.76 kg/m  Wt Readings from Last 3 Encounters:  10/04/20 (!) 448 lb (203.2 kg)  08/26/20 (!) 446 lb 1.9 oz (202.4 kg)  10/25/19 (!) 455 lb 1.9 oz (206.4 kg)     Health Maintenance Due  Topic Date Due   COVID-19 Vaccine (1) Never done   HPV VACCINES (2 - Male 3-dose series) 09/12/2015   TETANUS/TDAP  06/03/2020   INFLUENZA VACCINE  Never done       Topic Date Due   HPV VACCINES (2 - Male 3-dose series) 09/12/2015    Lab Results  Component Value Date   TSH 1.510 08/26/2020   Lab Results  Component Value Date   WBC 5.9 08/26/2020   HGB 13.5 08/26/2020   HCT 41.0 08/26/2020   MCV 84 08/26/2020   PLT 269 08/26/2020   Lab Results  Component Value Date   NA 142 08/26/2020   K 4.9 08/26/2020   CO2 19 (L) 08/26/2020   GLUCOSE 93 08/26/2020   BUN 11 08/26/2020   CREATININE 0.94 08/26/2020   BILITOT 0.5 08/26/2020   ALKPHOS 76 08/26/2020   AST 31 08/26/2020   ALT 52 (H) 08/26/2020   PROT 7.2 08/26/2020   ALBUMIN 4.2 08/26/2020   CALCIUM 9.7 08/26/2020   ANIONGAP 6 03/20/2014   EGFR 118 08/26/2020   Lab Results  Component Value Date   CHOL 142 08/26/2020   Lab Results  Component Value Date   HDL 48 08/26/2020   Lab Results  Component Value  Date   LDLCALC 78 08/26/2020   Lab Results  Component Value Date   TRIG 84 08/26/2020   Lab Results  Component Value Date   CHOLHDL 3.0 08/26/2020   Lab Results  Component Value Date  HGBA1C 5.9 (H) 08/26/2020      Assessment & Plan:   Problem List Items Addressed This Visit       Cardiovascular and Mediastinum   Essential hypertension    BP Readings from Last 1 Encounters:  10/04/20 (!) 140/92  Uncontrolled with Amlodipine 5 mg QD Started HCTZ 12.5 mg QD Counseled for compliance with the medications Advised DASH diet and moderate exercise/walking, at least 150 mins/week       Relevant Medications   hydrochlorothiazide (MICROZIDE) 12.5 MG capsule   amLODipine (NORVASC) 5 MG tablet     Other   Prediabetes - Primary    HbA1C: 5.9 Advised to follow diabetic diet       Meds ordered this encounter  Medications   hydrochlorothiazide (MICROZIDE) 12.5 MG capsule    Sig: Take 1 capsule (12.5 mg total) by mouth daily.    Dispense:  30 capsule    Refill:  3   amLODipine (NORVASC) 5 MG tablet    Sig: TAKE 1 TABLET(5 MG) BY MOUTH DAILY    Dispense:  30 tablet    Refill:  3    Follow-up: Return in about 4 months (around 02/03/2021) for HTN.    Lindell Spar, MD

## 2020-10-04 NOTE — Patient Instructions (Signed)
Please start taking HCTZ and continue taking Amlodipine.  Please continue to follow DASH diet and perform moderate exercise/walking at least 150 mins/week.

## 2021-02-07 ENCOUNTER — Ambulatory Visit: Payer: Medicaid Other | Admitting: Internal Medicine

## 2021-05-31 IMAGING — MR MRI OF THE LEFT KNEE WITHOUT CONTRAST
7 series · 40 of 40 positions shown · non-contrast
Comparison: Plain films left knee 05/20/2016

CLINICAL DATA: Medial left knee pain for 1 month since an injury
catching a ball. Initial encounter.

EXAM:
MRI OF THE LEFT KNEE WITHOUT CONTRAST
TECHNIQUE: Multiplanar, multisequence MR imaging of the knee was performed. No
intravenous contrast was administered.

[Series 3: T2 fat-sat · axial · left · 4.0mm · 0.56mm/px · z∈[-28,+105]mm · 6 of 32 slices shown (1 of 3)]
[im 1/32]
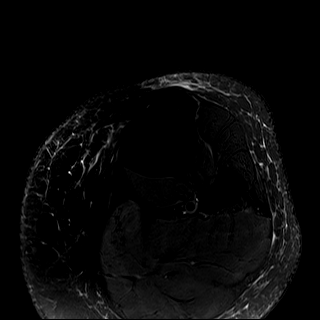
[im 7/32]
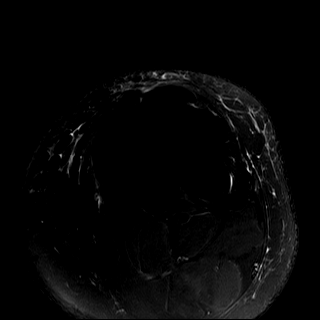
[im 13/32]
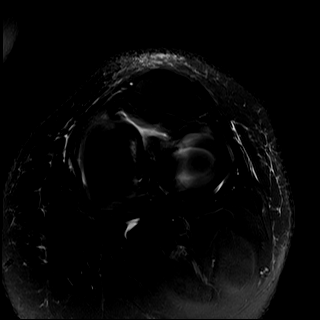
[im 19/32]
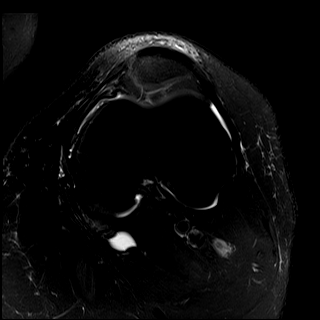
[im 25/32]
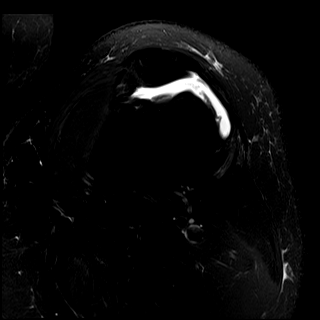
[im 32/32]
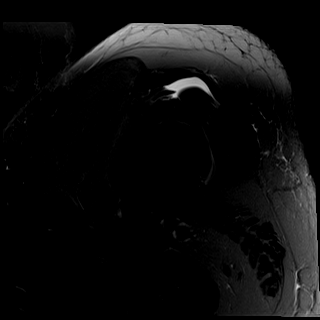

[Series 4: T1 · oblique · left · 4.0mm · 0.56mm/px · 6 of 32 slices shown]
[im 1/32]
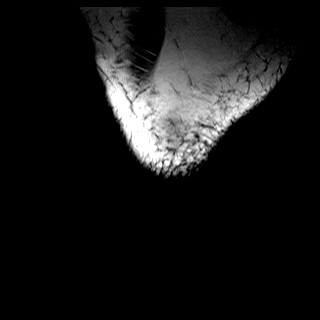
[im 7/32]
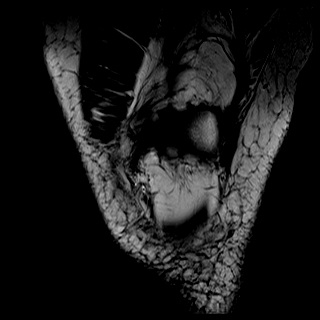
[im 13/32]
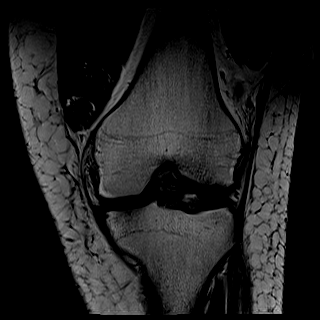
[im 19/32]
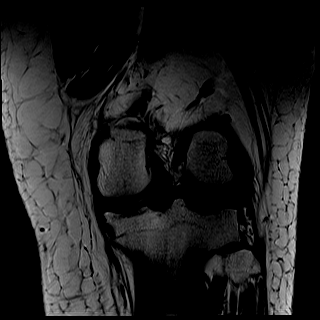
[im 25/32]
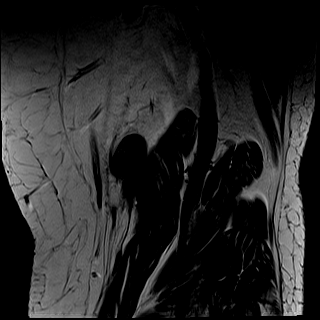
[im 32/32]
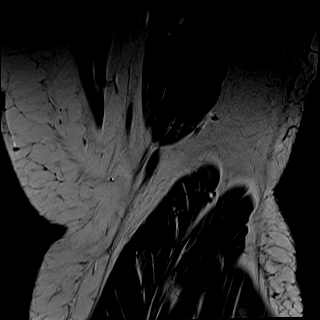

[Series 5: T2 fat-sat · oblique · left · 4.0mm · 0.56mm/px · 6 of 32 slices shown (2 of 3)]
[im 1/32]
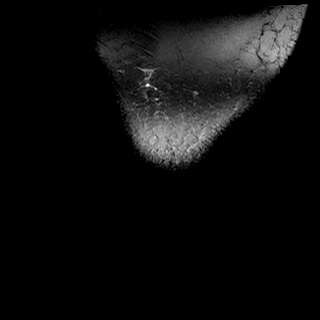
[im 7/32]
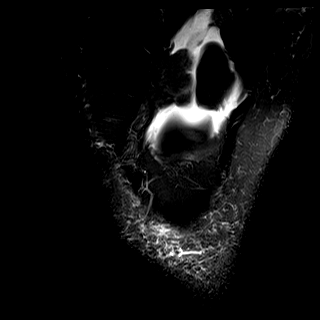
[im 13/32]
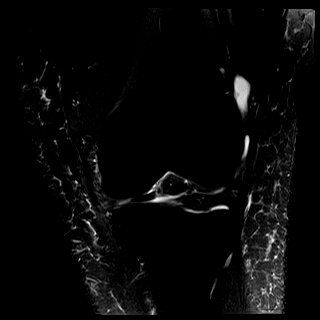
[im 19/32]
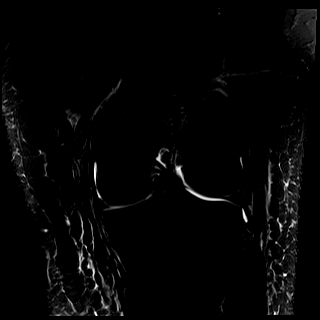
[im 25/32]
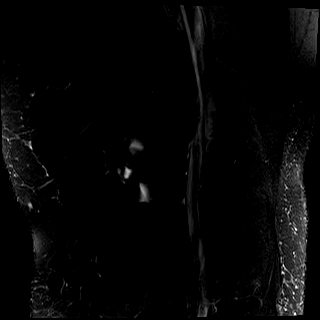
[im 32/32]
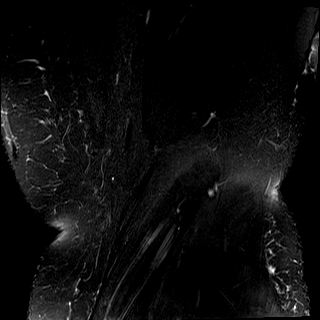

[Series 6: PD fat-sat · oblique · left · 3.0mm · 0.70mm/px · 7 of 35 slices shown (1 of 2)]
[im 1/35]
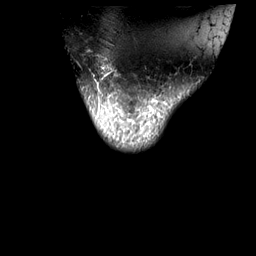
[im 6/35]
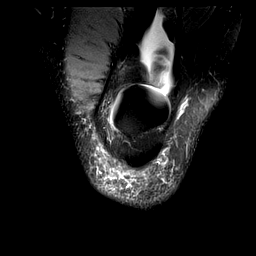
[im 12/35]
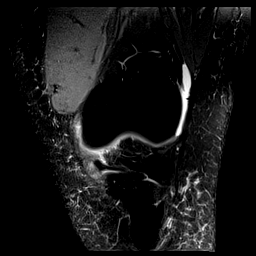
[im 18/35]
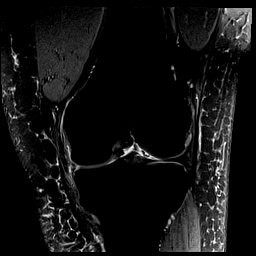
[im 23/35]
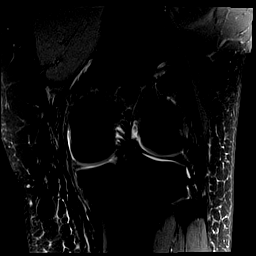
[im 29/35]
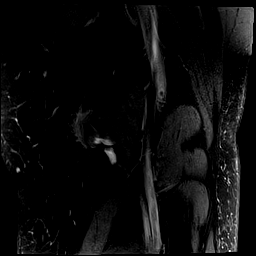
[im 35/35]
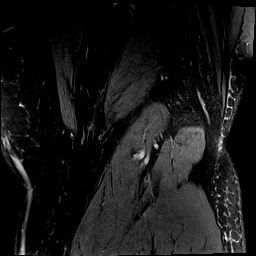

[Series 7: PD fat-sat · oblique · left · 3.3mm · 0.56mm/px · 6 of 29 slices shown (2 of 2)]
[im 1/29]
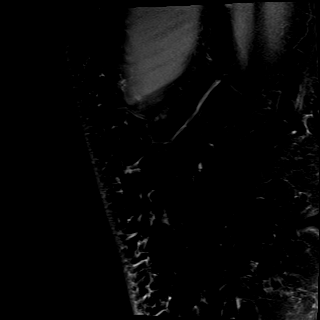
[im 6/29]
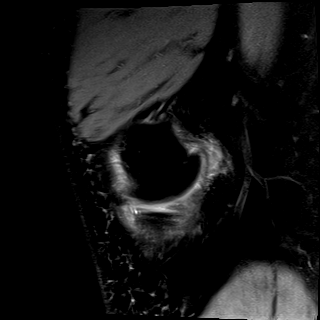
[im 12/29]
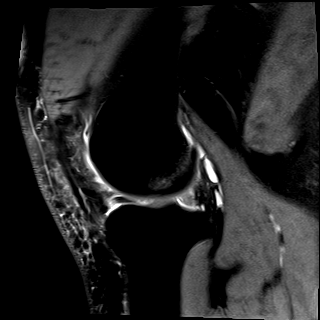
[im 17/29]
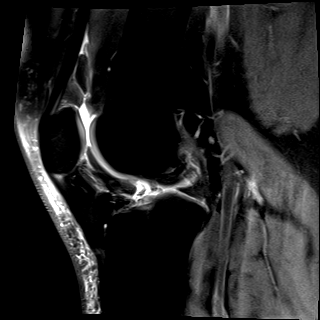
[im 23/29]
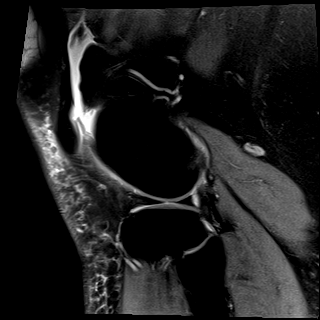
[im 29/29]
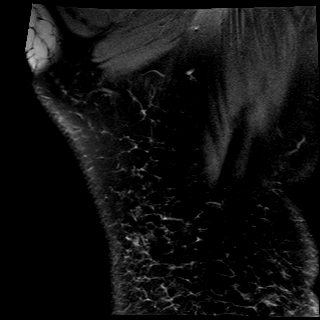

[Series 8: T2 fat-sat · oblique · left · 3.3mm · 0.56mm/px · 6 of 29 slices shown (3 of 3)]
[im 1/29]
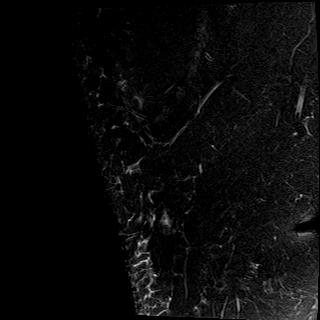
[im 6/29]
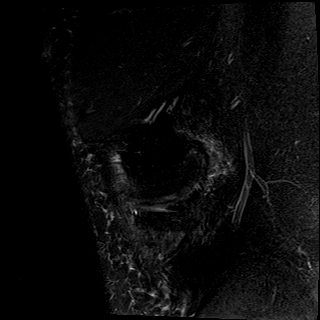
[im 12/29]
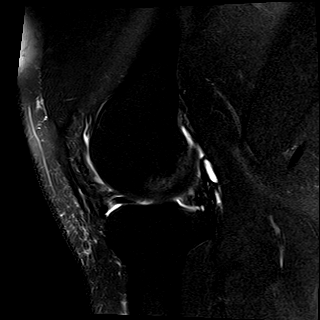
[im 17/29]
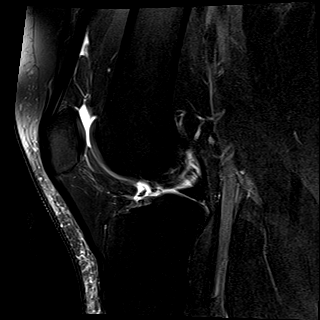
[im 23/29]
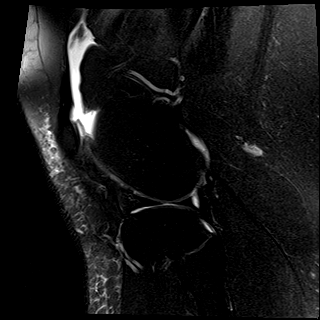
[im 29/29]
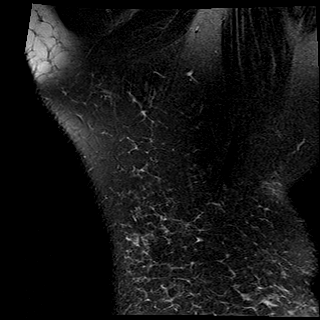

[Series 9: PD · oblique · left · 2.0mm · 0.59mm/px · 3 of 16 slices shown]
[im 1/16]
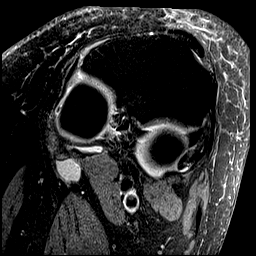
[im 8/16]
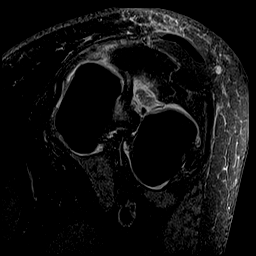
[im 16/16]
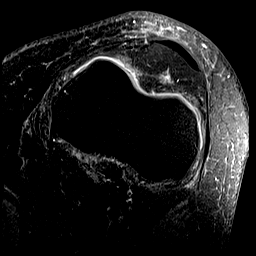

[40 of 40 positions shown; findings below may reference images not displayed]

FINDINGS: MENISCI

Medial meniscus: Bucket-handle tear is present with almost the
entire body and anterior horn flipped centrally. Longitudinal
component of the tear in the posterior horn is noted.

Lateral meniscus:  Intact.

LIGAMENTS

Cruciates:  Intact.

Collaterals:  Intact.

CARTILAGE

Patellofemoral:  Preserved.

Medial:  Preserved.

Lateral:  Preserved.

Joint:  Small joint effusion.

Popliteal Fossa:  Small Baker's cyst.

Extensor Mechanism:  Intact.

Bones: No fracture, contusion or worrisome lesion. Mild
osteophytosis about the knee noted.

Other: None.
IMPRESSION: Large bucket-handle tear of the medial meniscus with almost the
entire anterior horn and body flipped centrally. Longitudinal
component of the tear in the posterior horn is also seen.

Intact lateral meniscus, cruciate and collateral ligaments.

Small osteophytes about the knee consistent with early degenerative
change.

## 2021-10-21 ENCOUNTER — Ambulatory Visit
Admission: EM | Admit: 2021-10-21 | Discharge: 2021-10-21 | Disposition: A | Discharge status: Home / Self Care | Payer: Medicaid Other | Attending: Nurse Practitioner | Admitting: Nurse Practitioner

## 2021-10-21 DIAGNOSIS — I10 Essential (primary) hypertension: Secondary | ICD-10-CM

## 2021-10-21 DIAGNOSIS — R051 Acute cough: Secondary | ICD-10-CM

## 2021-10-21 DIAGNOSIS — Z1152 Encounter for screening for COVID-19: Secondary | ICD-10-CM

## 2021-10-21 MED ORDER — PROMETHAZINE-DM 6.25-15 MG/5ML PO SYRP: 5.0000 mL | ORAL_SOLUTION | Freq: Every evening | ORAL | 0 refills | Status: AC | PRN

## 2021-10-21 MED ORDER — AMLODIPINE BESYLATE 5 MG PO TABS: ORAL_TABLET | ORAL | 0 refills | Status: AC

## 2021-10-21 NOTE — ED Provider Notes (Signed)
[facility]    CSN: [id number] Arrival date & time: 10/21/21  1220      History   Chief Complaint Chief Complaint  Patient presents with   Cough    HPI Raymond Turner is a 21 y.o. male.   Patient presents for almost 2 weeks of dry and productive cough that is worse when he lays down at night and worse pursing in the morning.  He denies fever, body aches or chills, shortness of breath or chest pain, chest tightness, runny nose, sneezing, itchy nose or eyes, sore throat, headache, ear pain or pressure, abdominal pain, nausea/vomiting, diarrhea, decreased appetite, loss of taste or smell, new rash, and fatigue.  He does endorse a little bit of chest and nasal congestion with postnasal drainage and some sinus pressure above his eyes.  He has been taking DayQuil and NyQuil for his symptoms as well as Robitussin which helps for small periods of time.  Patient reports he is worried about going around his grandparents with this cough and does not want to pass anything along to them.  Patient reports he stopped taking amlodipine about 1 year ago.  Reports he stopped taking it because he just "kept forgetting".  Today, he denies chest pain, shortness of breath, vision changes, headache, dizziness/lightheadedness, lower extremity swelling.    Past Medical History:  Diagnosis Date   Acanthosis nigricans 08/17/2014   Asthma     Patient Active Problem List   Diagnosis Date Noted   Encounter for general adult medical examination with abnormal findings 08/26/2020   Essential hypertension 10/25/2019   Prediabetes 10/25/2019   Asthma, mild intermittent 08/17/2014   Morbid obesity ([place]) 08/17/2014    Past Surgical History:  Procedure Laterality Date   KNEE ARTHROSCOPY WITH ANTERIOR CRUCIATE LIGAMENT (ACL) REPAIR Left 08/08/2018   Procedure: KNEE ARTHROSCOPY WITH ANTERIOR CRUCIATE LIGAMENT (ACL) REPAIR;  Surgeon: Hiram Gash, MD;  Location: [place];  Service: Orthopedics;   Laterality: Left;   KNEE ARTHROSCOPY WITH MENISCAL REPAIR Left 08/08/2018   Procedure: KNEE ARTHROSCOPY WITH MENISCAL REPAIR;  Surgeon: Hiram Gash, MD;  Location: [place];  Service: Orthopedics;  Laterality: Left;       Home Medications    Prior to Admission medications   Medication Sig Start Date End Date Taking? Authorizing Provider  promethazine-dextromethorphan (PROMETHAZINE-DM) 6.25-15 MG/5ML syrup Take 5 mLs by mouth at bedtime as needed for cough. Do not take with alcohol or while driving or operating heavy machinery.  May cause drowsiness. 10/21/21  Yes Noemi Chapel A, NP  amLODipine (NORVASC) 5 MG tablet TAKE 1 TABLET(5 MG) BY MOUTH DAILY 10/21/21   Eulogio Bear, NP  hydrochlorothiazide (MICROZIDE) 12.5 MG capsule Take 1 capsule (12.5 mg total) by mouth daily. 10/04/20   Lindell Spar, MD    Family History Family History  Problem Relation Age of Onset   Hypertension Mother     Social History Social History   Tobacco Use   Smoking status: Never   Smokeless tobacco: Never  Vaping Use   Vaping Use: Never used  Substance Use Topics   Alcohol use: Yes    Comment: Occasionally   Drug use: No     Allergies   Patient has no known allergies.   Review of Systems Review of Systems Per HPI  Physical Exam Triage Vital Signs ED Triage Vitals  Enc Vitals Group     BP 10/21/21 1233 (!) 166/111     Pulse Rate 10/21/21 1233 84  Resp 10/21/21 1233 18     Temp 10/21/21 1233 98.9 F (37.2 C)     Temp Source 10/21/21 1233 Oral     SpO2 10/21/21 1233 95 %     Weight --      Height --      Head Circumference --      Peak Flow --      Pain Score 10/21/21 1232 0     Pain Loc --      Pain Edu? --      Excl. in GC? --    No data found.  Updated Vital Signs BP (!) 166/111 (BP Location: Right Wrist)   Pulse 84   Temp 98.9 F (37.2 C) (Oral)   Resp 18   SpO2 95%   Visual Acuity Right Eye Distance:   Left Eye Distance:   Bilateral Distance:     Right Eye Near:   Left Eye Near:    Bilateral Near:     Physical Exam Vitals and nursing note reviewed.  Constitutional:      General: He is not in acute distress.    Appearance: Normal appearance. He is not ill-appearing or toxic-appearing.  HENT:     Head: Normocephalic and atraumatic.     Right Ear: Tympanic membrane, ear canal and external ear normal.     Left Ear: Tympanic membrane, ear canal and external ear normal.     Nose: No congestion or rhinorrhea.     Right Sinus: No maxillary sinus tenderness or frontal sinus tenderness.     Left Sinus: No maxillary sinus tenderness or frontal sinus tenderness.     Mouth/Throat:     Mouth: Mucous membranes are moist.     Pharynx: Oropharynx is clear. Posterior oropharyngeal erythema present. No oropharyngeal exudate.  Eyes:     General: No scleral icterus.    Extraocular Movements: Extraocular movements intact.  Cardiovascular:     Rate and Rhythm: Normal rate and regular rhythm.  Pulmonary:     Effort: Pulmonary effort is normal. No respiratory distress.     Breath sounds: Normal breath sounds. No wheezing, rhonchi or rales.  Abdominal:     General: Abdomen is flat. Bowel sounds are normal. There is no distension.     Palpations: Abdomen is soft.     Tenderness: There is no abdominal tenderness.  Musculoskeletal:     Cervical back: Normal range of motion and neck supple.  Lymphadenopathy:     Cervical: No cervical adenopathy.  Skin:    General: Skin is warm and dry.     Coloration: Skin is not jaundiced or pale.     Findings: No erythema or rash.  Neurological:     Mental Status: He is alert and oriented to person, place, and time.  Psychiatric:        Behavior: Behavior is cooperative.      UC Treatments / Results  Labs (all labs ordered are listed, but only abnormal results are displayed) Labs Reviewed  SARS CORONAVIRUS 2 (TAT 6-24 HRS)    EKG   Radiology No results found.  Procedures Procedures  (including critical care time)  Medications Ordered in UC Medications - No data to display  Initial Impression / Assessment and Plan / UC Course  I have reviewed the triage vital signs and the nursing notes.  Pertinent labs & imaging results that were available during my care of the patient were reviewed by me and considered in my medical decision making (see chart  for details).   Patient is well-appearing, afebrile, not tachycardic, not tachypneic, oxygenating well on room air.  He is hypertensive, however has not taken blood pressure medication in quite some time.  He is not having any symptoms of elevated blood pressure today.  Acute cough Encounter for screening for COVID-19 Likely postviral cough Discussed with patient-low likelihood for being contagious at this point, however he desires COVID-19 testing, so we obtained Supportive care discussed Avoid phenylephrine given elevated blood pressure; recommended starting/continuing Mucinex Can use cough syrup at nighttime as needed ER and return precautions discussed Note given for work  Essential hypertension Resume amlodipine 5 mg Follow-up with primary care provider within 2 weeks for blood pressure recheck and titration of medication ER and return precautions discussed  The patient was given the opportunity to ask questions.  All questions answered to their satisfaction.  The patient is in agreement to this plan.    Final Clinical Impressions(s) / UC Diagnoses   Final diagnoses:  Acute cough  Essential hypertension  Encounter for screening for COVID-19     Discharge Instructions      You most likely have a post viral cough.  This can last for a few weeks after a viral infection.  We have tested you for COVID-19 and will notify you if this comes back positive.  Since it has been more than 5 days since your symptoms started, it is very unlikely that you are contagious at this point.  Some things that can make you feel  better are: - Increased rest - Increasing fluid with water/sugar free electrolytes - Acetaminophen and ibuprofen as needed for fever/pain.  - Salt water gargling, chloraseptic spray and throat lozenges - OTC guaifenesin (Mucinex) 600 mg twice daily.  - Saline sinus flushes or a neti pot.  - Humidifying the air.  AVOID any OTC medications with phenylephrine.  Please resume your blood pressure medication (amlodipine) and schedule a follow up appointment with your primary care provider within the next 2 weeks for blood pressure recheck.  If you develop headache, vision changes, chest pain, shortness of breath, or dizziness/lightheadedness, you need to call 911 or go to the ER.     ED Prescriptions     Medication Sig Dispense Auth. Provider   amLODipine (NORVASC) 5 MG tablet TAKE 1 TABLET(5 MG) BY MOUTH DAILY 30 tablet Cathlean Marseilles A, NP   promethazine-dextromethorphan (PROMETHAZINE-DM) 6.25-15 MG/5ML syrup Take 5 mLs by mouth at bedtime as needed for cough. Do not take with alcohol or while driving or operating heavy machinery.  May cause drowsiness. 118 mL Valentino Nose, NP      PDMP not reviewed this encounter.   Valentino Nose, NP 10/21/21 1316

## 2021-10-21 NOTE — Discharge Instructions (Addendum)
You most likely have a post viral cough.  This can last for a few weeks after a viral infection.  We have tested you for COVID-19 and will notify you if this comes back positive.  Since it has been more than 5 days since your symptoms started, it is very unlikely that you are contagious at this point.  Some things that can make you feel better are: - Increased rest - Increasing fluid with water/sugar free electrolytes - Acetaminophen and ibuprofen as needed for fever/pain.  - Salt water gargling, chloraseptic spray and throat lozenges - OTC guaifenesin (Mucinex) 600 mg twice daily.  - Saline sinus flushes or a neti pot.  - Humidifying the air.  AVOID any OTC medications with phenylephrine.  Please resume your blood pressure medication (amlodipine) and schedule a follow up appointment with your primary care provider within the next 2 weeks for blood pressure recheck.  If you develop headache, vision changes, chest pain, shortness of breath, or dizziness/lightheadedness, you need to call 911 or go to the ER.

## 2021-10-21 NOTE — ED Triage Notes (Signed)
Pt reports cough and nasal congestion x 1 1/2 week. DayQuil and NyQuil gives some relief.

## 2021-10-22 LAB — SARS CORONAVIRUS 2 (TAT 6-24 HRS): SARS Coronavirus 2: NEGATIVE

## 2022-05-27 ENCOUNTER — Other Ambulatory Visit: Payer: Self-pay

## 2022-05-27 ENCOUNTER — Encounter (HOSPITAL_BASED_OUTPATIENT_CLINIC_OR_DEPARTMENT_OTHER): Payer: Self-pay

## 2022-05-27 ENCOUNTER — Emergency Department (HOSPITAL_BASED_OUTPATIENT_CLINIC_OR_DEPARTMENT_OTHER): Payer: Self-pay

## 2022-05-27 ENCOUNTER — Emergency Department (HOSPITAL_BASED_OUTPATIENT_CLINIC_OR_DEPARTMENT_OTHER)
Admission: EM | Admit: 2022-05-27 | Discharge: 2022-05-27 | Disposition: A | Discharge status: Home / Self Care | Payer: Self-pay | Attending: Emergency Medicine | Admitting: Emergency Medicine

## 2022-05-27 DIAGNOSIS — I10 Essential (primary) hypertension: Secondary | ICD-10-CM | POA: Insufficient documentation

## 2022-05-27 DIAGNOSIS — R001 Bradycardia, unspecified: Secondary | ICD-10-CM | POA: Insufficient documentation

## 2022-05-27 DIAGNOSIS — R079 Chest pain, unspecified: Secondary | ICD-10-CM | POA: Insufficient documentation

## 2022-05-27 DIAGNOSIS — R0789 Other chest pain: Secondary | ICD-10-CM

## 2022-05-27 DIAGNOSIS — J45909 Unspecified asthma, uncomplicated: Secondary | ICD-10-CM | POA: Insufficient documentation

## 2022-05-27 LAB — BASIC METABOLIC PANEL
Anion gap: 9 (ref 5–15)
BUN: 12 mg/dL (ref 6–20)
CO2: 27 mmol/L (ref 22–32)
Calcium: 8.8 mg/dL — ABNORMAL LOW (ref 8.9–10.3)
Chloride: 102 mmol/L (ref 98–111)
Creatinine, Ser: 1.07 mg/dL (ref 0.61–1.24)
GFR, Estimated: >60 mL/min (ref >60)
Glucose, Bld: 83 mg/dL (ref 70–99)
Potassium: 3.5 mmol/L (ref 3.5–5.1)
Sodium: 138 mmol/L (ref 135–145)

## 2022-05-27 LAB — CBC WITH DIFFERENTIAL/PLATELET
Abs Immature Granulocytes: 0.01 10*3/uL (ref 0.00–0.07)
Basophils Absolute: 0.1 10*3/uL (ref 0.0–0.1)
Basophils Relative: 1 %
Eosinophils Absolute: 0.1 10*3/uL (ref 0.0–0.5)
Eosinophils Relative: 2 %
HCT: 42.4 % (ref 39.0–52.0)
Hemoglobin: 13.8 g/dL (ref 13.0–17.0)
Immature Granulocytes: 0 %
Lymphocytes Relative: 42 %
Lymphs Abs: 2.5 10*3/uL (ref 0.7–4.0)
MCH: 28 pg (ref 26.0–34.0)
MCHC: 32.5 g/dL (ref 30.0–36.0)
MCV: 86 fL (ref 80.0–100.0)
Monocytes Absolute: 0.4 10*3/uL (ref 0.1–1.0)
Monocytes Relative: 7 %
Neutro Abs: 2.8 10*3/uL (ref 1.7–7.7)
Neutrophils Relative %: 48 %
Platelets: 264 10*3/uL (ref 150–400)
RBC: 4.93 MIL/uL (ref 4.22–5.81)
RDW: 12.7 % (ref 11.5–15.5)
WBC: 6 10*3/uL (ref 4.0–10.5)
nRBC: 0 % (ref 0.0–0.2)

## 2022-05-27 LAB — TROPONIN I (HIGH SENSITIVITY)
Troponin I (High Sensitivity): 4 ng/L (ref <18)
Troponin I (High Sensitivity): 4 ng/L (ref <18)

## 2022-05-27 NOTE — ED Triage Notes (Signed)
States got to work this morning and started having chest tightness. Hx of asthma, denies shortness of breath.

## 2022-05-27 NOTE — ED Provider Notes (Signed)
[facility] Provider Note   CSN: [id number] Arrival date & time: 05/27/22  0957     History  Chief Complaint  Patient presents with   Chest Pain    Raymond Turner is a 22 y.o. male with past medical history significant for asthma, hypertension, obesity presents to the ED complaining of chest tightness and a "throbbing" feeling.  Patient states that he was at work when his symptoms began.  He was not doing any strenuous activities with the chest tightness started.  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  Patient states felt that he "had to take a moment to gather himself".  Denies shortness of breath, syncope, cough, palpitations, dizziness, light-headedness, syncope.  He reports that he no longer has to use rescue inhaler and has not had an asthma exacerbation since he was a child.       Home Medications Prior to Admission medications   Medication Sig Start Date End Date Taking? Authorizing Provider  amLODipine (NORVASC) 5 MG tablet TAKE 1 TABLET(5 MG) BY MOUTH DAILY Patient not taking: Reported on 05/27/2022 10/21/21   Valentino Nose, NP  hydrochlorothiazide (MICROZIDE) 12.5 MG capsule Take 1 capsule (12.5 mg total) by mouth daily. Patient not taking: Reported on 05/27/2022 10/04/20   Anabel Halon, MD  promethazine-dextromethorphan (PROMETHAZINE-DM) 6.25-15 MG/5ML syrup Take 5 mLs by mouth at bedtime as needed for cough. Do not take with alcohol or while driving or operating heavy machinery.  May cause drowsiness. Patient not taking: Reported on 05/27/2022 10/21/21   Valentino Nose, NP      Allergies    Patient has no known allergies.    Review of Systems   Review of Systems  Respiratory:  Positive for chest tightness. Negative for cough and shortness of breath.   Cardiovascular:  Positive for chest pain. Negative for palpitations.  Neurological:  Negative for dizziness,  syncope and light-headedness.    Physical Exam Updated Vital Signs BP 139/75 (BP Location: Left Arm)   Pulse 64   Temp 98.6 F (37 C) (Oral)   Resp 16   Ht 6' (1.829 m)   Wt (!) 176.9 kg   SpO2 99%   BMI 52.89 kg/m  Physical Exam Vitals and nursing note reviewed.  Constitutional:      General: He is not in acute distress.    Appearance: He is not ill-appearing.  HENT:     Mouth/Throat:     Mouth: Mucous membranes are moist.     Pharynx: Oropharynx is clear.  Cardiovascular:     Rate and Rhythm: Regular rhythm. Bradycardia present. No extrasystoles are present.    Pulses: Normal pulses.     Heart sounds: Normal heart sounds.  Pulmonary:     Effort: Pulmonary effort is normal. No respiratory distress.     Breath sounds: Normal breath sounds and air entry. No wheezing.  Abdominal:     General: There is no distension.  Musculoskeletal:     Right lower leg: No edema.     Left lower leg: No edema.  Skin:    General: Skin is warm and dry.     Capillary Refill: Capillary refill takes less than 2 seconds.  Neurological:     Mental Status: He is alert. Mental status is at baseline.  Psychiatric:        Mood and Affect: Mood normal.  Behavior: Behavior normal.     ED Results / Procedures / Treatments   Labs (all labs ordered are listed, but only abnormal results are displayed) Labs Reviewed  BASIC METABOLIC PANEL - Abnormal; Notable for the following components:      Result Value   Calcium 8.8 (*)    All other components within normal limits  CBC WITH DIFFERENTIAL/PLATELET  TROPONIN I (HIGH SENSITIVITY)  TROPONIN I (HIGH SENSITIVITY)    EKG EKG Interpretation  Date/Time:  Wednesday May 27 2022 10:03:53 EDT Ventricular Rate:  66 PR Interval:  163 QRS Duration: 87 QT Interval:  382 QTC Calculation: 401 R Axis:   76 Text Interpretation: Sinus rhythm when compared to prior, overall similar appearance No STEMI Confirmed by Theda Belfast ([id number]) on  05/27/2022 10:27:51 AM  Radiology DG Chest 2 View  Result Date: 05/27/2022 CLINICAL DATA:  Chest tightness, history of asthma. EXAM: CHEST - 2 VIEW COMPARISON:  Chest radiograph dated November 09, 2008 FINDINGS: The heart size and mediastinal contours are within normal limits. Both lungs are clear. The visualized skeletal structures are unremarkable. IMPRESSION: No active cardiopulmonary disease. Electronically Signed   By: Larose Hires D.O.   On: 05/27/2022 11:47    Procedures Procedures    Medications Ordered in ED Medications - No data to display  ED Course/ Medical Decision Making/ A&P                             Medical Decision Making Amount and/or Complexity of Data Reviewed Labs: ordered. Radiology: ordered.   This patient presents to the ED with chief complaint(s) of chest pain and tightness with pertinent past medical history of asthma.  The complaint involves an extensive differential diagnosis and also carries with it a high risk of complications and morbidity.    The differential diagnosis includes ACS, asthma exacerbation, costochondritis, GERD, esophageal spasm, pleuritis, stable angina, valvular heart disease    The initial plan is to obtain chest pain workup  Initial Assessment:   On exam, patient is overall well-appearing 22 year old male who is not in acute distress.  Heart heart rate is in the upper 50s with regular rhythm.  No extrasystoles appreciated.  Sinus rhythm on the monitor.  Lungs are clear to auscultation bilaterally with adequate tidal volume.  Skin is warm and dry.  Independent ECG/labs interpretation:  The following labs were independently interpreted:  CBC without anemia or leukocytosis.  Metabolic panel with mild hypocalcemia, no other electrolyte disturbance.  Renal function is normal.  Delta troponin of 0.  Independent visualization and interpretation of imaging: I independently visualized the following imaging with scope of interpretation  limited to determining acute life threatening conditions related to emergency care: chest x-ray, which revealed normal cardiac silhouette, no pneumonia, pleural effusion, pneumothorax.  I agree with radiologist interpretation.  Disposition:   Patient's workup today is overall reassuring and his chest tightness and pain not likely due to ACS.  Patient reports that his tightness in "throbbing" feeling in his chest have improved while being in the ED.  Troponins have been negative.  Will refer patient to cardiology for follow-up.  The patient has been appropriately medically screened and/or stabilized in the ED. I have low suspicion for any other emergent medical condition which would require further screening, evaluation or treatment in the ED or require inpatient management. At time of discharge the patient is hemodynamically stable and in no acute distress. I have discussed work-up results  and diagnosis with patient and answered all questions. Patient is agreeable with discharge plan. We discussed strict return precautions for returning to the emergency department and they verbalized understanding.            Final Clinical Impression(s) / ED Diagnoses Final diagnoses:  Chest pain, unspecified type  Chest tightness    Rx / DC Orders ED Discharge Orders          Ordered    Ambulatory referral to Cardiology       Comments: If you have not heard from the Cardiology office within the next 72 hours please call [phone].   05/27/22 1417              Lenard Simmer, PA-C 05/27/22 1423    Tegeler, Canary Brim, MD 05/27/22 [phone]

## 2022-05-27 NOTE — Discharge Instructions (Signed)
Thank you for allowing me to be part of your care today.  Your workup today is overall reassuring.  Your chest x-ray did not show enlargement of your heart or problems with your lungs.  Your troponin (heart enzyme) and ECG were also normal.  I have sent a referral over to cardiology.  Their office will call you to schedule a follow-up appointment.  Return to the ED if you develop sudden worsening of your symptoms or if you have any new concerns.

## 2022-05-28 ENCOUNTER — Telehealth: Payer: Self-pay

## 2022-05-28 NOTE — Transitions of Care (Post Inpatient/ED Visit) (Signed)
   06/04/2022  Name: Raymond Turner MRN: [id number] DOB: 2000-03-15  Today's TOC FU Call Status: Today's TOC FU Call Status:: Successful TOC FU Call Competed TOC FU Call Complete Date: 06/04/22  Transition Care Management Follow-up Telephone Call Date of Discharge: 05/27/22 [facility] Type of Discharge: Emergency Department Reason for ED Visit: Other:, Cardiac Conditions How have you been since you were released from the hospital?: Better Any questions or concerns?: No  Items Reviewed: Did you receive and understand the discharge instructions provided?: Yes Medications obtained,verified, and reconciled?: Yes (Medications Reviewed) Any new allergies since your discharge?: No Dietary orders reviewed?: NA Do you have support at home?: Yes People in Home: parent(s)  Medications Reviewed Today: Medications Reviewed Today     Reviewed by Annabell Sabal, CMA (Certified Medical Assistant) on 06/04/22 at 1537  Med List Status: <None>   Medication Order Taking? Sig Documenting Provider Last Dose Status Informant  amLODipine (NORVASC) 5 MG tablet [id number] Yes TAKE 1 TABLET(5 MG) BY MOUTH DAILY Valentino Nose, NP Taking Active Self, Pharmacy Records  hydrochlorothiazide (MICROZIDE) 12.5 MG capsule [id number] Yes Take 1 capsule (12.5 mg total) by mouth daily. Anabel Halon, MD Taking Active Self, Pharmacy Records  promethazine-dextromethorphan (PROMETHAZINE-DM) 6.25-15 MG/5ML syrup [id number] Yes Take 5 mLs by mouth at bedtime as needed for cough. Do not take with alcohol or while driving or operating heavy machinery.  May cause drowsiness. Valentino Nose, NP Taking Active Self, Pharmacy Records            Home Care and Equipment/Supplies: Were Home Health Services Ordered?: No Any new equipment or medical supplies ordered?: No  Functional Questionnaire: Do you need assistance with bathing/showering or dressing?: No Do you need assistance with  meal preparation?: No Do you need assistance with eating?: No Do you have difficulty maintaining continence: No Do you need assistance with getting out of bed/getting out of a chair/moving?: No Do you have difficulty managing or taking your medications?: No  Follow up appointments reviewed: PCP Follow-up appointment confirmed?: No MD Provider Line Number:[phone] Given: Yes Specialist Hospital Follow-up appointment confirmed?: No Reason Specialist Follow-Up Not Confirmed: Patient has Specialist Provider Number and will Call for Appointment Do you need transportation to your follow-up appointment?: No Do you understand care options if your condition(s) worsen?: Yes-patient verbalized understanding    SIGNATURE Fredirick Maudlin

## 2022-06-09 NOTE — Telephone Encounter (Signed)
Called patient left a voicemail to call our office to schedule an ER follow up

## 2023-04-27 ENCOUNTER — Telehealth: Payer: Self-pay

## 2023-04-27 NOTE — Telephone Encounter (Signed)
 Copied from CRM [id number]. Topic: Appointments - Appointment Scheduling >> Apr 27, 2023  2:02 PM [place] B wrote: Pt's mother called in for appt, put on waiting list for sooner appt

## 2023-04-27 NOTE — Telephone Encounter (Unsigned)
 Copied from CRM [id number]. Topic: Appointments - Appointment Scheduling >> Apr 27, 2023  2:02 PM [place] B wrote: Pt's mother called in for appt, put on waiting list for sooner appt
